# Patient Record
Sex: Female | Born: 1935 | Race: Asian | Hispanic: No | Marital: Single
Health system: Southern US, Community
[De-identification: ages and names within clinical notes are randomized; demographics above are authoritative.]

---

## 2014-05-02 ENCOUNTER — Encounter (HOSPITAL_COMMUNITY): Payer: Self-pay | Admitting: Emergency Medicine

## 2014-05-02 ENCOUNTER — Emergency Department (HOSPITAL_COMMUNITY)
Admission: EM | Admit: 2014-05-02 | Discharge: 2014-05-02 | Disposition: A | Discharge status: Home / Self Care | Payer: Medicare Other | Attending: Emergency Medicine | Admitting: Emergency Medicine

## 2014-05-02 ENCOUNTER — Emergency Department (HOSPITAL_COMMUNITY): Payer: Medicare Other

## 2014-05-02 DIAGNOSIS — R071 Chest pain on breathing: Secondary | ICD-10-CM | POA: Insufficient documentation

## 2014-05-02 DIAGNOSIS — R0789 Other chest pain: Secondary | ICD-10-CM

## 2014-05-02 DIAGNOSIS — R079 Chest pain, unspecified: Secondary | ICD-10-CM | POA: Diagnosis present

## 2014-05-02 LAB — CBC
HCT: 40.5 % (ref 36.0–46.0)
Hemoglobin: 13.8 g/dL (ref 12.0–15.0)
MCH: 32 pg (ref 26.0–34.0)
MCHC: 34.1 g/dL (ref 30.0–36.0)
MCV: 94 fL (ref 78.0–100.0)
Platelets: 272 10*3/uL (ref 150–400)
RBC: 4.31 MIL/uL (ref 3.87–5.11)
RDW: 13 % (ref 11.5–15.5)
WBC: 5.7 10*3/uL (ref 4.0–10.5)

## 2014-05-02 LAB — BASIC METABOLIC PANEL
ANION GAP: 11 (ref 5–15)
BUN: 17 mg/dL (ref 6–23)
CHLORIDE: 100 meq/L (ref 96–112)
CO2: 27 mEq/L (ref 19–32)
Calcium: 9.6 mg/dL (ref 8.4–10.5)
Creatinine, Ser: 0.58 mg/dL (ref 0.50–1.10)
GFR calc non Af Amer: 84 mL/min — ABNORMAL LOW (ref >90)
Glucose, Bld: 137 mg/dL — ABNORMAL HIGH (ref 70–99)
POTASSIUM: 4.9 meq/L (ref 3.7–5.3)
SODIUM: 138 meq/L (ref 137–147)

## 2014-05-02 LAB — I-STAT TROPONIN, ED: TROPONIN I, POC: 0 ng/mL (ref 0.00–0.08)

## 2014-05-02 NOTE — ED Notes (Signed)
Pt reports having mid chest pains for several days, increases when moving and difficulty sleeping. Reports pain improved with rest but wants eval. No acute distress noted at triage, ekg done.

## 2014-05-02 NOTE — Discharge Instructions (Signed)
Chest Pain (Nonspecific) °It is often hard to give a specific diagnosis for the cause of chest pain. There is always a chance that your pain could be related to something serious, such as a heart attack or a blood clot in the lungs. You need to follow up with your health care provider for further evaluation. °CAUSES  °· Heartburn. °· Pneumonia or bronchitis. °· Anxiety or stress. °· Inflammation around your heart (pericarditis) or lung (pleuritis or pleurisy). °· A blood clot in the lung. °· A collapsed lung (pneumothorax). It can develop suddenly on its own (spontaneous pneumothorax) or from trauma to the chest. °· Shingles infection (herpes zoster virus). °The chest wall is composed of bones, muscles, and cartilage. Any of these can be the source of the pain. °· The bones can be bruised by injury. °· The muscles or cartilage can be strained by coughing or overwork. °· The cartilage can be affected by inflammation and become sore (costochondritis). °DIAGNOSIS  °Lab tests or other studies may be needed to find the cause of your pain. Your health care provider may have you take a test called an ambulatory electrocardiogram (ECG). An ECG records your heartbeat patterns over a 24-hour period. You may also have other tests, such as: °· Transthoracic echocardiogram (TTE). During echocardiography, sound waves are used to evaluate how blood flows through your heart. °· Transesophageal echocardiogram (TEE). °· Cardiac monitoring. This allows your health care provider to monitor your heart rate and rhythm in real time. °· Holter monitor. This is a portable device that records your heartbeat and can help diagnose heart arrhythmias. It allows your health care provider to track your heart activity for several days, if needed. °· Stress tests by exercise or by giving medicine that makes the heart beat faster. °TREATMENT  °· Treatment depends on what may be causing your chest pain. Treatment may include: °¨ Acid blockers for  heartburn. °¨ Anti-inflammatory medicine. °¨ Pain medicine for inflammatory conditions. °¨ Antibiotics if an infection is present. °· You may be advised to change lifestyle habits. This includes stopping smoking and avoiding alcohol, caffeine, and chocolate. °· You may be advised to keep your head raised (elevated) when sleeping. This reduces the chance of acid going backward from your stomach into your esophagus. °Most of the time, nonspecific chest pain will improve within 2-3 days with rest and mild pain medicine.  °HOME CARE INSTRUCTIONS  °· If antibiotics were prescribed, take them as directed. Finish them even if you start to feel better. °· For the next few days, avoid physical activities that bring on chest pain. Continue physical activities as directed. °· Do not use any tobacco products, including cigarettes, chewing tobacco, or electronic cigarettes. °· Avoid drinking alcohol. °· Only take medicine as directed by your health care provider. °· Follow your health care provider's suggestions for further testing if your chest pain does not go away. °· Keep any follow-up appointments you made. If you do not go to an appointment, you could develop lasting (chronic) problems with pain. If there is any problem keeping an appointment, call to reschedule. °SEEK MEDICAL CARE IF:  °· Your chest pain does not go away, even after treatment. °· You have a rash with blisters on your chest. °· You have a fever. °SEEK IMMEDIATE MEDICAL CARE IF:  °· You have increased chest pain or pain that spreads to your arm, neck, jaw, back, or abdomen. °· You have shortness of breath. °· You have an increasing cough, or you cough   up blood. °· You have severe back or abdominal pain. °· You feel nauseous or vomit. °· You have severe weakness. °· You faint. °· You have chills. °This is an emergency. Do not wait to see if the pain will go away. Get medical help at once. Call your local emergency services (911 in [place] Do not drive  yourself to the hospital. °MAKE SURE YOU:  °· Understand these instructions. °· Will watch your condition. °· Will get help right away if you are not doing well or get worse. °Document Released: 06/17/2005 Document Revised: 09/12/2013 Document Reviewed: 04/12/2008 °ExitCare® Patient Information ©2015 [employer]. This information is not intended to replace advice given to you by your health care provider. Make sure you discuss any questions you have with your health care provider. ° ° °Emergency Department Resource Guide °1) Find a Doctor and Pay Out of Pocket °Although you won't have to find out who is covered by your insurance plan, it is a good idea to ask around and get recommendations. You will then need to call the office and see if the doctor you have chosen will accept you as a new patient and what types of options they offer for patients who are self-pay. Some doctors offer discounts or will set up payment plans for their patients who do not have insurance, but you will need to ask so you aren't surprised when you get to your appointment. ° °2) Contact Your Local Health Department °Not all health departments have doctors that can see patients for sick visits, but many do, so it is worth a call to see if yours does. If you don't know where your local health department is, you can check in your phone book. The CDC also has a tool to help you locate your state's health department, and many state websites also have listings of all of their local health departments. ° °3) Find a Walk-in Clinic °If your illness is not likely to be very severe or complicated, you may want to try a walk in clinic. These are popping up all over the country in pharmacies, drugstores, and shopping centers. They're usually staffed by nurse practitioners or physician assistants that have been trained to treat common illnesses and complaints. They're usually fairly quick and inexpensive. However, if you have serious medical issues or  chronic medical problems, these are probably not your best option. ° °No Primary Care Doctor: °- Call [facility] at  832-8000 - they can help you locate a primary care doctor that  accepts your insurance, provides certain services, etc. °- Physician Referral Service- 1-800-533-3463 ° °Chronic Pain Problems: °Organization         Address  Phone   Notes  °[facility]  (336) 297-2271 Patients need to be referred by their primary care doctor.  ° °Medication Assistance: °Organization         Address  Phone   Notes  °[place] [facility] [address] °[place], [place] [address] (336) 641-8030 --Must be a resident of [place] °-- Must have NO insurance coverage whatsoever (no Medicaid/ Medicare, etc.) °-- The pt. MUST have a primary care doctor that directs their care regularly and follows them in the community °  °[facility]  (866) 331-1348   °[employer]  (888) 892-1162   ° °Agencies that provide inexpensive medical care: °Organization         Address  Phone   Notes  °[facility]  (  336) 832-8035   °[facility]    (336) 832-7272   °[facility] [address] °[place], [place] [address] (336) 832-4777   °[facility] [address], °[place] (336) 271-4999   °[facility]    (336) 373-0678   °[facility]    (336) 272-1050   °[facility] ° [address], [place] Phone:  (336) 832-4444, Fax:  (336) 832-4440 Hours of Operation:  9 am - 6 pm, M-F.  Also accepts Medicaid/Medicare and self-pay.  °[facility] ° [address], [place] Phone: (336) 832-3150, Fax: (336) 832-3151. Hours of Operation:  8:30 am - 5:30 pm, M-F.  Also accepts Medicaid and self-pay.  °[facility] [place] [address], [place] Phone: (336) 878-6027   °[facility] [address], [place], [place]  (336)723-1848, Ext. 123 Mondays & Thursdays: 7-9 AM.  First 15 patients are seen on a first come, first serve basis. °  ° °Medicaid-accepting [place] Providers: ° °Organization         Address  Phone   Notes  °[facility] [address], [place] (336) 641-2100 Also accepts self-pay patients.  °[facility] [address], [place] ° (336) 856-9996   °[facility] [address], [place] (336) 288-8857   °[facility] [address] [place] [address], [place] (336) 299-7000   °Veita Bland [address], [place]  ° (336) 373-1557 Only accepts  Access Medicaid patients after they have their name applied to their card.  ° °Self-Pay (no insurance) in [place]: ° °Organization         Address  Phone   Notes  °Sickle Cell Patients, [facility] [address], [place] (336) 832-1970   °[facility] [address], [place] (336) 832-4400   °[facility] ° [address], [place] (336) 992-4800   °[facility]/Dr. Osei-Bonsu ° [address] [place] [address], [place] or [address], [place] (336) 841-8500 Phone number for both [place] and [place] locations is the same.  °[facility] [address], [place] (336) 299-0000   °[facility] [address] [place] [address], [place] or [address] (336) 852-7530 °(336) 878-2260   °[facility] [address],  (336) 350-1642, phone; (336) 294-5005, fax Sees patients 1st and 3rd Saturday of every month.  Must not qualify for public or private insurance (i.e. Medicaid, Medicare, [place] Health Choice, Veterans' Benefits) • Household income should be no more than 200% of the poverty level •The clinic cannot treat you if you are pregnant or think you are pregnant • Sexually transmitted  diseases are not treated at the clinic.  ° ° °Dental Care: °Organization         Address  Phone  Notes  °[facility] [address],  (336) 641-6152 Accepts children up to age 21 who are enrolled in Medicaid or [place] Health Choice; pregnant women with a Medicaid card; and children who have applied for Medicaid or [place] Health Choice, but were declined, whose parents can pay a reduced fee at time of service.  °[facility] [place]    [address], [place] (336) 641-7733 Accepts children up to age 21 who are enrolled in Medicaid or [place] Health Choice; pregnant women with a Medicaid card; and children who have applied for Medicaid or [place] Health Choice, but were declined, whose parents can pay a reduced fee at time of service.  °[facility] ° [address], [place] (336) 641-4533 Patients are seen by appointment only. Walk-ins are not accepted. [facility] will see patients 18 years of age and older. °Monday - Tuesday (8am-5pm) °Most Wednesdays (8:30-5pm) °$30 per visit, cash only  °[facility] ° [address], [place] (336) 641-4533 Patients are seen by appointment only. Walk-ins are not accepted. [facility] will see patients 18 years of age and older. °One Wednesday Evening (Monthly: Volunteer Based).  $30 per visit, cash only  °[facility]  (919) 537-3737 for adults; Children under age 4, call Graduate Pediatric Dentistry at (919) [phone]. Children aged 4-14, please call (919) [phone] to request a pediatric application. ° Dental services are provided in all areas of dental care including fillings, crowns and bridges, complete and partial dentures, implants, gum treatment, root canals, and extractions. Preventive care is also provided. Treatment is provided to both adults and children. °Patients are selected via a  lottery and there is often a waiting list. °  °[facility] [address], °[place] ° (336) 763-8833 [email or web address].[email or web address] °  °[facility] [address], [place], [place] (336)723-1848, Ext. 123 Second and Fourth Thursday of each month, opens at 6:30 AM; Clinic ends at 9 AM.  Patients are seen on a first-come first-served basis, and a limited number are seen during each clinic.  ° °[facility] ° [address], [place], [place] (336) 723-7904   Eligibility Requirements °You must have lived in [place], [place], or [place] counties for at least the last three months. °  You cannot be eligible for state or federal sponsored healthcare insurance, including Veterans Administration, Medicaid, or Medicare. °  You generally cannot be eligible for healthcare insurance through your employer.  °  How to apply: °Eligibility screenings are held every Tuesday and Wednesday afternoon from 1:00 pm until 4:00 pm. You do not need an appointment for the interview!  °[facility] [address], [place], [place] 336-631-2330   °[facility]  336-342-8273   °[facility]  336-703-3100   °[facility]  336-570-6415   ° °Behavioral Health Resources in the Community: °Intensive Outpatient Programs °Organization         Address  Phone  Notes  °[facility] [address], [place], [place] 336-878-6098   °[facility] [address], [place], [place] 336-832-9800   °[facility] [address], [place], [place] ° 336-882-2125   °[facility] [address],  °[place], [place] 1-800-853-5163 or 336-641-4981   °Substance Abuse Resources °Organization         Address  Phone  Notes  °[facility]  336-882-2125   °[facility]  336-784-9470   °[facility]  336-285-9073   °[facility]  336-845-3988   °Residential &  Outpatient Substance Abuse Program  1-800-659-3381   °Psychological Services °Organization         Address  Phone  Notes  °[facility]  336- 832-9600   °  [facility]  336- 378-7881   °[facility] [address], [place] 1-800-853-5163 or 336-641-4981   ° °Mobile Crisis Teams °Organization         Address  Phone  Notes  °[facility], Mobile Crisis Care Unit  1-877-626-1772   °Assertive °[facility] ° [address] [place], [place] 336-834-9664   °Sharon DeEsch [address] °[place] 336-554-5454   ° °Self-Help/Support Groups °Organization         Address  Phone             Notes  °[facility] - variety of support groups  336- 373-1402 Call for more information  °Narcotics Anonymous (NA), [facility] [address], °[place]  2 meetings at this location  ° °Residential Treatment Programs °Organization         Address  Phone  Notes  °[facility] [address],    °[place]  1-866-801-8205   °[facility] ° [address], [place], [place] 704-293-8524   °[facility] [address], [place] 336-845-3988 Admissions: 8am-3pm M-F  °[facility] [address]    °[place], [place] 336-841-1104   °[facility] [address], [place], [place] 336-379-7146   °[facility] [address]  °[place], [place] 336-285-9073   °[facility] Intensive Outpatient [address], [place], [place] 336-852-3033   °[facility] [address]  °[place], [place] 1-877-615-2722 or 336-784-9470   °[facility] [address] [place], [place] 336-227-7417 Accepts Medicaid  °[facility] [address]  °[place] 1-800-659-3381 Substance Abuse/Addiction Treatment  ° °[place] Behavioral Health Resources °Organization          Address  Phone  Notes  °[facility]  (888) 581-9988   °Julie Brannon, PhD [address] [place], [place]   (336) 349-5553 or (336) 951-0000   °[facility]   [address] °[place], [place] (336) 349-4454   °[facility] [address], [place], [place] (336) 342-8316 Insurance/Medicaid/sponsorship through [facility]  °[facility] [address]                                    [place], [place] (336) 342-8316 Therapy/tele-psych/case  °[facility] [address]  ° [place],  (336) 349-2233    °Dr. Arfeen  (336) 349-4544   °[facility] [place]  [employer] [place] [facility] 1) [address],  °2) [address], [place] °3)  [address], [place] (336) 349-3220 °(336) 342-7768 ° °(336) 342-8140   °[place] [facility] (336) 342-1394 or (336) 342-3537 (After Hours)    ° ° ° °

## 2014-05-02 NOTE — ED Notes (Signed)
Pt comfortable with discharge and follow up instructions. No prescriptions. 
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
## 2016-11-29 ENCOUNTER — Encounter: Payer: Self-pay | Admitting: *Deleted

## 2016-11-29 NOTE — Congregational Nurse Program (Unsigned)
Congregational Nurse Program Note  Date of Encounter: 11/29/2016  Past Medical History: No past medical history on file.  Encounter Details:     CNP Questionnaire - 11/29/16 2028      Patient Demographics   Is this a new or existing patient? New   Patient is considered a/an Immigrant   Race Asian     Patient Assistance   [facility]   Patient's financial/insurance status Medicare   Uninsured Patient ([employer] officer, trade unionCard/Care Connects) No   Patient referred to apply for the following financial assistance Not Applicable   Food insecurities addressed Not Applicable   Transportation assistance No   Assistance securing medications No   Educational health offerings Diabetes;Exercise/physical activity;Nutrition;Hypertension     Encounter Details   Primary purpose of visit Education/Health Concerns;Post PCP Visit   Was an Emergency Department visit averted? Not Applicable  pt refused to go urgent care   Does patient have a medical provider? Yes   Patient referred to Clinic  has appoint with MD one week later   Was a mental health screening completed? (GAINS tool) No   Does patient have dental issues? Yes  lost front lower teeth 2   Was a dental referral made? No   Does patient have vision issues? No   Does your patient have an abnormal blood pressure today? Yes   Since previous encounter, have you referred patient for abnormal blood pressure that resulted in a new diagnosis or medication change? Yes  no medicine on   Does your patient have an abnormal blood glucose today? Yes  brought own machine, cbg 145 non fasting   Since previous encounter, have you referred patient for abnormal blood glucose that resulted in a new diagnosis or medication change? No  see CNP note    The 83 years old female, lives alone, when pt. Visited clinic couple of weeks ago , high blood sugar and high BP at the Dr's office. BP was 180's but no medicine on, also CBG was 190's. She  said Dr. Rx med. For high blood sugar, but she would not take , wanted to control by diet. Told me her blood sugar down to 160's during past week and today, she brought her CBG machine, checked sugar, 145 @ 1030 today, non fasting.  Taught about hypertension, advised to go to urgent care, but she said her BP was 180's at the Dr's office, no BP medicine prescribed. Make an note and gave to her that need show to MD. Regarding BP. Will F/u BP and CBG next week. Taught pt that high risk of stroke, DM,damage to other organ, and taught nutrition and information given. She said would not taking medicine because have ti take all the time. She never had HTN or DM before. No other c/o voiced.

## 2016-12-08 ENCOUNTER — Encounter: Payer: Self-pay | Admitting: *Deleted
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
## 2016-12-13 LAB — GLUCOSE, POCT (MANUAL RESULT ENTRY): POC GLUCOSE: 133 mg/dL — AB (ref 70–99)

## 2016-12-22 ENCOUNTER — Encounter: Payer: Self-pay | Admitting: *Deleted

## 2016-12-22 DIAGNOSIS — I10 Essential (primary) hypertension: Secondary | ICD-10-CM | POA: Insufficient documentation

## 2016-12-22 DIAGNOSIS — Z139 Encounter for screening, unspecified: Secondary | ICD-10-CM

## 2016-12-22 DIAGNOSIS — R739 Hyperglycemia, unspecified: Secondary | ICD-10-CM | POA: Insufficient documentation
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
## 2017-01-12 ENCOUNTER — Telehealth: Payer: Self-pay | Admitting: *Deleted

## 2017-02-07 ENCOUNTER — Encounter: Payer: Self-pay | Admitting: *Deleted

## 2017-02-07 DIAGNOSIS — Z139 Encounter for screening, unspecified: Secondary | ICD-10-CM

## 2017-02-07 NOTE — Congregational Nurse Program (Unsigned)
02/07/2017 1030am Pt visited to check CBG and BP Said on medicine for Diabetes to eat freely. Taught regarding diet and exercise even on the medicine. CGG result; 146. F/u for DM and HTN required.

## 2020-02-06 ENCOUNTER — Emergency Department (HOSPITAL_COMMUNITY)
Admission: EM | Admit: 2020-02-06 | Discharge: 2020-02-06 | Disposition: A | Discharge status: Home / Self Care | Payer: Medicare Other | Attending: Emergency Medicine | Admitting: Emergency Medicine

## 2020-02-06 ENCOUNTER — Emergency Department (HOSPITAL_COMMUNITY): Payer: Medicare Other

## 2020-02-06 ENCOUNTER — Other Ambulatory Visit: Payer: Self-pay

## 2020-02-06 ENCOUNTER — Encounter (HOSPITAL_COMMUNITY): Payer: Self-pay

## 2020-02-06 DIAGNOSIS — R630 Anorexia: Secondary | ICD-10-CM | POA: Diagnosis not present

## 2020-02-06 DIAGNOSIS — R739 Hyperglycemia, unspecified: Secondary | ICD-10-CM

## 2020-02-06 DIAGNOSIS — E1165 Type 2 diabetes mellitus with hyperglycemia: Secondary | ICD-10-CM | POA: Insufficient documentation

## 2020-02-06 DIAGNOSIS — R1110 Vomiting, unspecified: Secondary | ICD-10-CM | POA: Diagnosis not present

## 2020-02-06 DIAGNOSIS — F0390 Unspecified dementia without behavioral disturbance: Secondary | ICD-10-CM | POA: Diagnosis not present

## 2020-02-06 DIAGNOSIS — Z7984 Long term (current) use of oral hypoglycemic drugs: Secondary | ICD-10-CM | POA: Insufficient documentation

## 2020-02-06 DIAGNOSIS — R112 Nausea with vomiting, unspecified: Secondary | ICD-10-CM

## 2020-02-06 DIAGNOSIS — I10 Essential (primary) hypertension: Secondary | ICD-10-CM | POA: Insufficient documentation

## 2020-02-06 LAB — CBC
HCT: 43 % (ref 36.0–46.0)
Hemoglobin: 14.4 g/dL (ref 12.0–15.0)
MCH: 30.8 pg (ref 26.0–34.0)
MCHC: 33.5 g/dL (ref 30.0–36.0)
MCV: 92.1 fL (ref 80.0–100.0)
Platelets: 230 10*3/uL (ref 150–400)
RBC: 4.67 MIL/uL (ref 3.87–5.11)
RDW: 12.3 % (ref 11.5–15.5)
WBC: 5.7 10*3/uL (ref 4.0–10.5)
nRBC: 0 % (ref 0.0–0.2)

## 2020-02-06 LAB — DIFFERENTIAL
Abs Immature Granulocytes: 0.01 10*3/uL (ref 0.00–0.07)
Basophils Absolute: 0.1 10*3/uL (ref 0.0–0.1)
Basophils Relative: 2 %
Eosinophils Absolute: 0.1 10*3/uL (ref 0.0–0.5)
Eosinophils Relative: 1 %
Immature Granulocytes: 0 %
Lymphocytes Relative: 32 %
Lymphs Abs: 1.8 10*3/uL (ref 0.7–4.0)
Monocytes Absolute: 0.4 10*3/uL (ref 0.1–1.0)
Monocytes Relative: 7 %
Neutro Abs: 3.3 10*3/uL (ref 1.7–7.7)
Neutrophils Relative %: 58 %

## 2020-02-06 LAB — CBG MONITORING, ED: Glucose-Capillary: 244 mg/dL — ABNORMAL HIGH (ref 70–99)

## 2020-02-06 LAB — I-STAT CHEM 8, ED
BUN: 20 mg/dL (ref 8–23)
Calcium, Ion: 1.17 mmol/L (ref 1.15–1.40)
Chloride: 101 mmol/L (ref 98–111)
Creatinine, Ser: 0.6 mg/dL (ref 0.44–1.00)
Glucose, Bld: 300 mg/dL — ABNORMAL HIGH (ref 70–99)
HCT: 43 % (ref 36.0–46.0)
Hemoglobin: 14.6 g/dL (ref 12.0–15.0)
Potassium: 4.3 mmol/L (ref 3.5–5.1)
Sodium: 136 mmol/L (ref 135–145)
TCO2: 28 mmol/L (ref 22–32)

## 2020-02-06 LAB — COMPREHENSIVE METABOLIC PANEL
ALT: 71 U/L — ABNORMAL HIGH (ref 0–44)
AST: 64 U/L — ABNORMAL HIGH (ref 15–41)
Albumin: 3.9 g/dL (ref 3.5–5.0)
Alkaline Phosphatase: 73 U/L (ref 38–126)
Anion gap: 12 (ref 5–15)
BUN: 17 mg/dL (ref 8–23)
CO2: 23 mmol/L (ref 22–32)
Calcium: 9.5 mg/dL (ref 8.9–10.3)
Chloride: 99 mmol/L (ref 98–111)
Creatinine, Ser: 0.81 mg/dL (ref 0.44–1.00)
GFR calc Af Amer: >60 mL/min (ref >60)
GFR calc non Af Amer: >60 mL/min (ref >60)
Glucose, Bld: 297 mg/dL — ABNORMAL HIGH (ref 70–99)
Potassium: 4.6 mmol/L (ref 3.5–5.1)
Sodium: 134 mmol/L — ABNORMAL LOW (ref 135–145)
Total Bilirubin: 0.8 mg/dL (ref 0.3–1.2)
Total Protein: 8.4 g/dL — ABNORMAL HIGH (ref 6.5–8.1)

## 2020-02-06 LAB — PROTIME-INR
INR: 1 (ref 0.8–1.2)
Prothrombin Time: 13.2 seconds (ref 11.4–15.2)

## 2020-02-06 LAB — APTT: aPTT: 30 seconds (ref 24–36)

## 2020-02-06 MED ORDER — JANUMET 50-1000 MG PO TABS: 1.0000 | ORAL_TABLET | Freq: Two times a day (BID) | ORAL | 0 refills | Status: AC

## 2020-02-06 MED ORDER — ONDANSETRON HCL 8 MG PO TABS: 8.0000 mg | ORAL_TABLET | Freq: Three times a day (TID) | ORAL | 0 refills | Status: AC | PRN

## 2020-02-06 MED ORDER — SODIUM CHLORIDE 0.9% FLUSH: 3.0000 mL | Freq: Once | INTRAVENOUS | Status: DC

## 2020-02-06 NOTE — Progress Notes (Addendum)
CSW spoke with French Ana, who is the Comptroller at the patient's apartment complex - [address] on [address]. French Ana reports patient lives alone with no local family support. French Ana reports [employer] is the patient's PCP. French Ana reports the patient is very hard of hearing. French Ana reports the patient's emergency contact Emily Filbert is deceased and was a former Network engineer. French Ana reports that the patient speaks [place] and very limited [place]. French Ana will review the patient's housing record and return call to CSW with a family contact if available.   Edwin Dada, MSW, LCSW-A Transitions of Care  Clinical Social Worker  [facility] Emergency Departments  Medical ICU [phone]

## 2020-02-06 NOTE — Social Work (Signed)
ToC Team spoke via phone to Pt's nephew and great niece, Darral Dash @ [phone] asking for update on Pt.  Team was able to gather information regarding family situation and will update after medical evaluation.

## 2020-02-06 NOTE — ED Notes (Signed)
Pt able to tolerated fluids and crackers as ordered, no sign of nausea, vomiting or swallow problem noticed.

## 2020-02-06 NOTE — Discharge Planning (Signed)
RNCM notified of pt ED admission and possible need for high level of care post medical evaluation. RNCM given pt community SW number [phone]) to gather collateral information.

## 2020-02-06 NOTE — ED Triage Notes (Signed)
Pt bib [facility] w/ c/o worsening dementia x 1 year. Pt c/o headache x 3 days. At baseline neuro status. Pt living at independent living facility, however, concern from social worker there that pt is no longer capable of caring for herself, possible need for placement at nursing facility. EMS VSS.

## 2020-02-06 NOTE — ED Notes (Signed)
Case management provided w/ patient's social workers phone number Kathy Miller [phone])

## 2020-02-06 NOTE — Progress Notes (Signed)
CSW obtained verbal permission from Pt to contact [place] from [employer].[phone] to update about Pt's plans for discharge.  For Correction of Notes: [phone] is not a French Ana, she is the Child psychotherapist and friend of the Pt who looks out for the Pt. It was Health and safety inspector who contacted ToC staff to supply collateral information earlier today as well as to call EMS.  French Ana is also willing to act as a contact for any Home Health services.  CSW also updated Pt's niece Helen @[phone] French Ana  CSW accompanied EDP during interview with Pt and nephew, Mr. that was conducted via [employer] with interpreter # [phone] providing [facility] interpretation during interview.
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
## 2020-02-06 NOTE — Discharge Instructions (Signed)
Start with a clear liquid diet then gradually advance to regular foods over the next few days.  Follow-up with your doctor for further care and treatment as needed.

## 2020-02-07 NOTE — Progress Notes (Signed)
CSW received call from niece requesting information about the Home Health services for this Pt. CSW was able to provide assistance so that niece can navigate language barrier between Pt and HH agency.

## 2020-02-08 ENCOUNTER — Telehealth: Payer: Self-pay | Admitting: Surgery

## 2020-02-08 ENCOUNTER — Telehealth: Payer: Self-pay

## 2020-02-08 NOTE — Telephone Encounter (Signed)
ED CM received call from [facility] today that they are unable to provide [facility] serivcies due to being out of network with [facility].   CM contacted [facility] with referral, will review and get back to [place] with a decision in the am.  [facility] team will update family

## 2020-02-08 NOTE — Telephone Encounter (Signed)
CSW called niece Elby Beck to update about change in [facility] from [facility] to [facility].

## 2022-01-03 IMAGING — CT CT HEAD W/O CM
4 series · 16 of 47 positions shown, 18 images · non-contrast
Comparison: None.

CLINICAL DATA: 89-year-old female with headache for 3 days.
Worsening dementia.

EXAM:
CT HEAD WITHOUT CONTRAST
TECHNIQUE: Contiguous axial images were obtained from the base of the skull
through the vertex without intravenous contrast.

[Series 2: head without · axial · non-contrast · 0.39mm/px · z∈[-150,-40]mm · 7 of 30 slices shown, 9 images]
[im 4/30  brain]
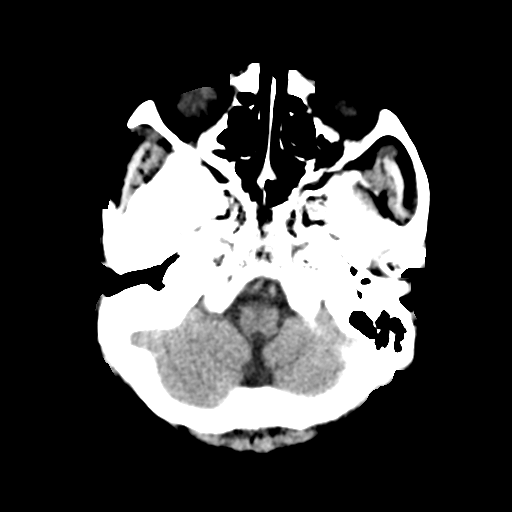
[im 4/30  bone]
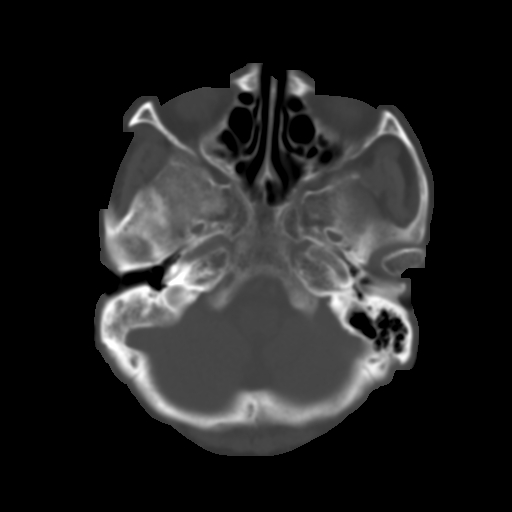
[im 8/30  brain]
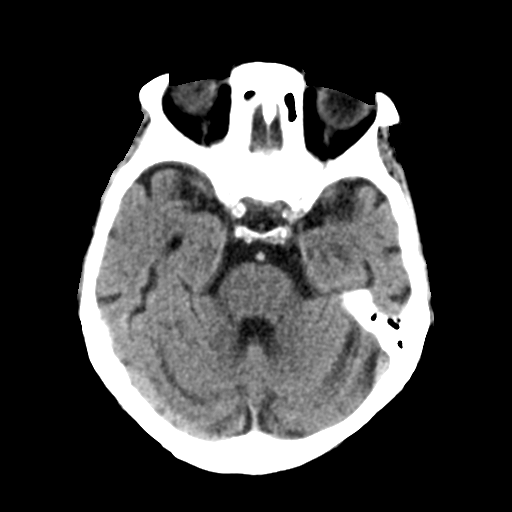
[im 11/30  brain]
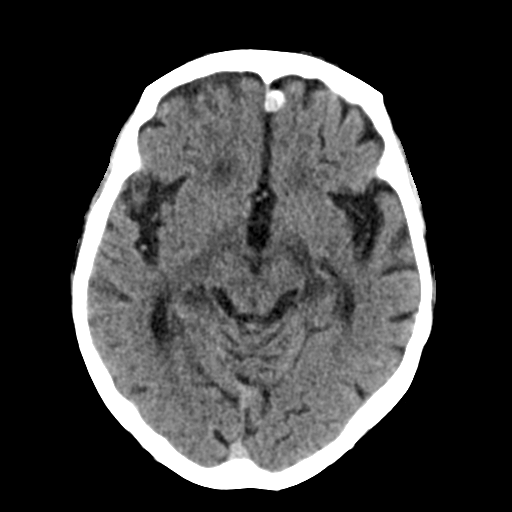
[im 15/30  brain]
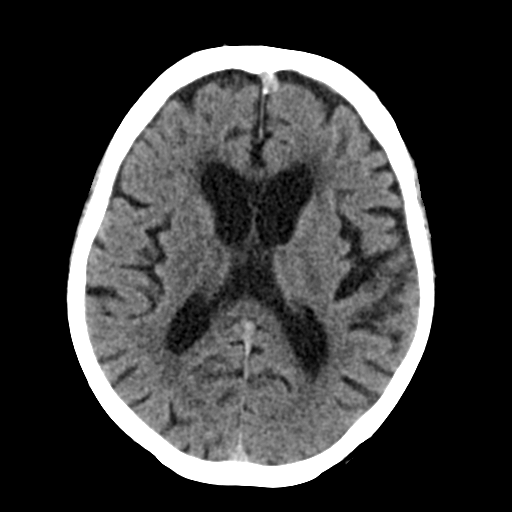
[im 19/30  brain]
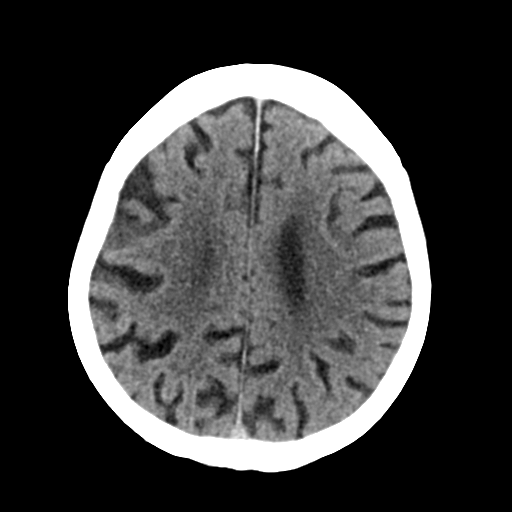
[im 19/30  bone]
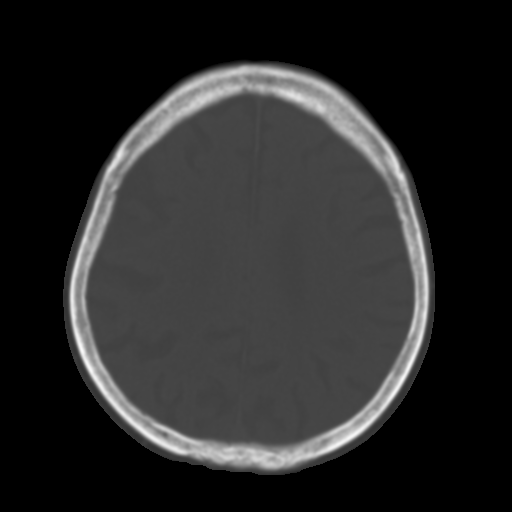
[im 22/30  brain]
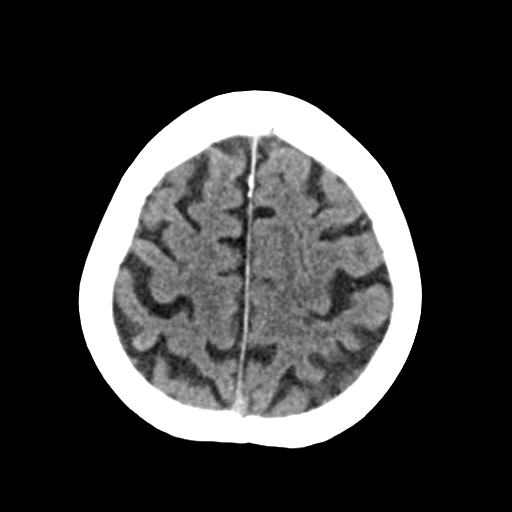
[im 26/30  brain]
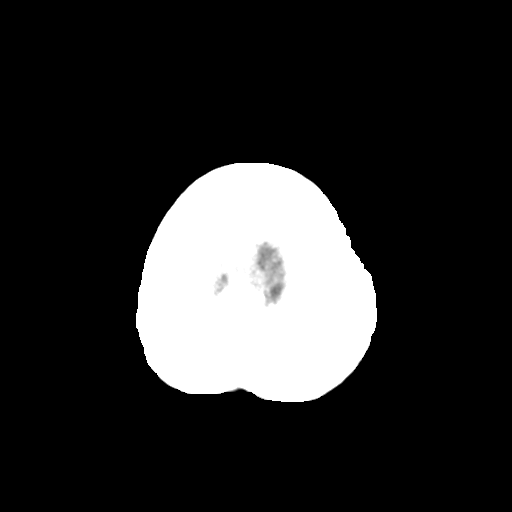

[Series 3: head bone · axial · 0.39mm/px · z∈[-152,-124]mm · 3 of 73 slices shown]
[im 8/73  bone]
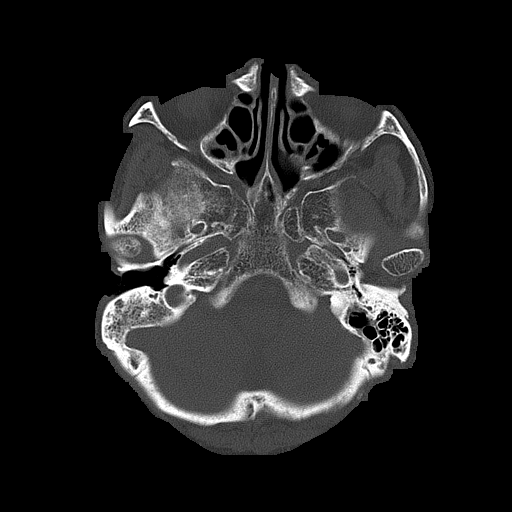
[im 15/73  bone]
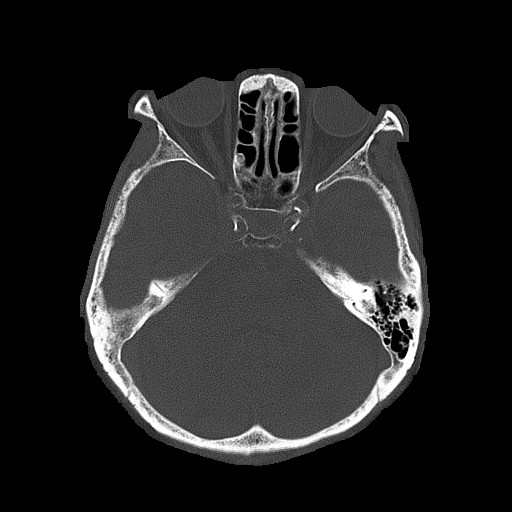
[im 22/73  bone]
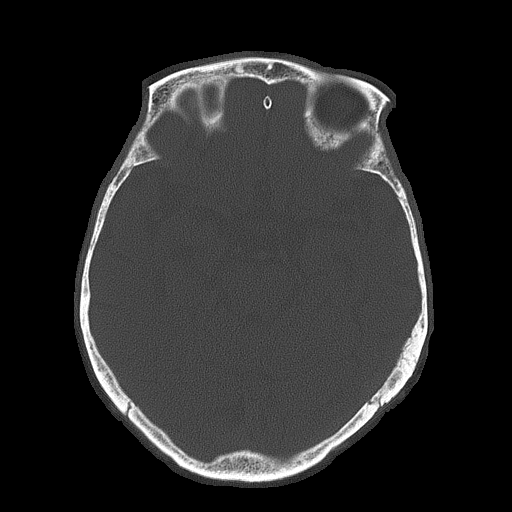

[Series 4: head without cor · coronal · non-contrast · 0.30mm/px · 3 of 67 slices shown]
[im 23/67  brain]
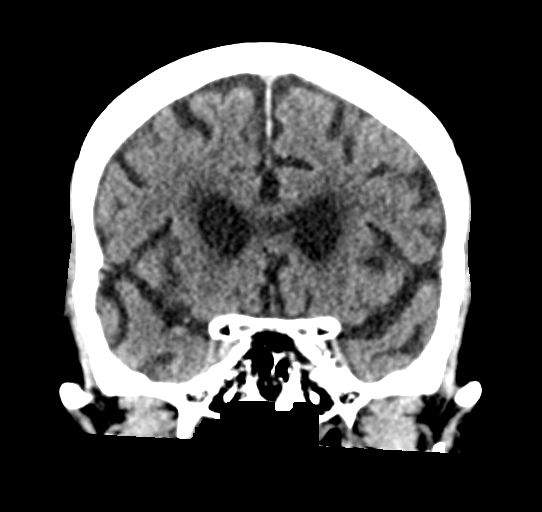
[im 30/67  brain]
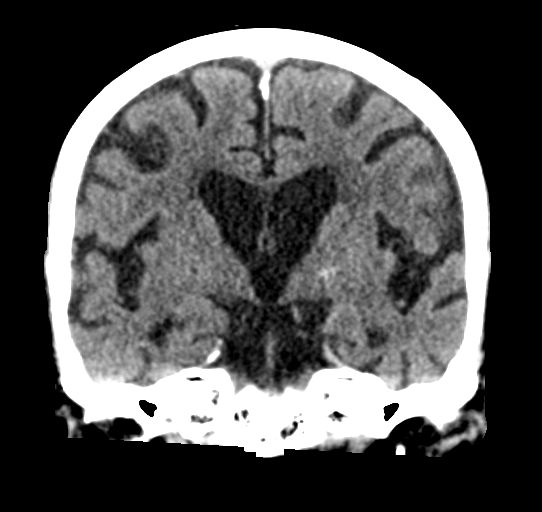
[im 37/67  brain]
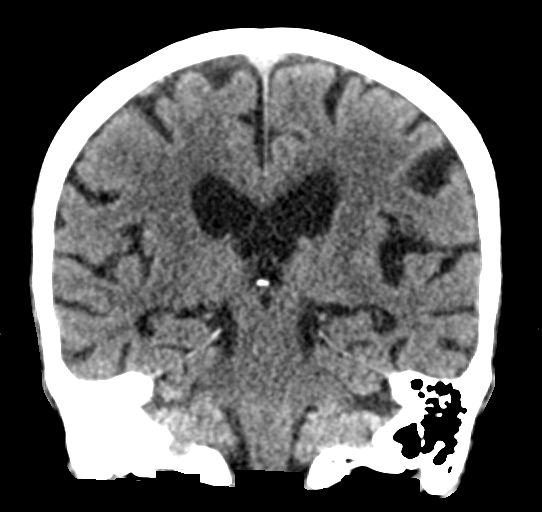

[Series 5: head without sag · sagittal · non-contrast · 0.28mm/px · 3 of 57 slices shown]
[im 19/57  brain]
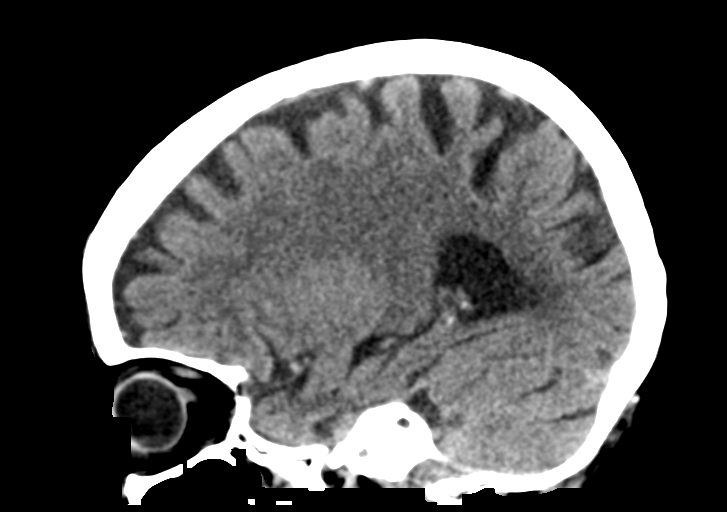
[im 29/57  brain]
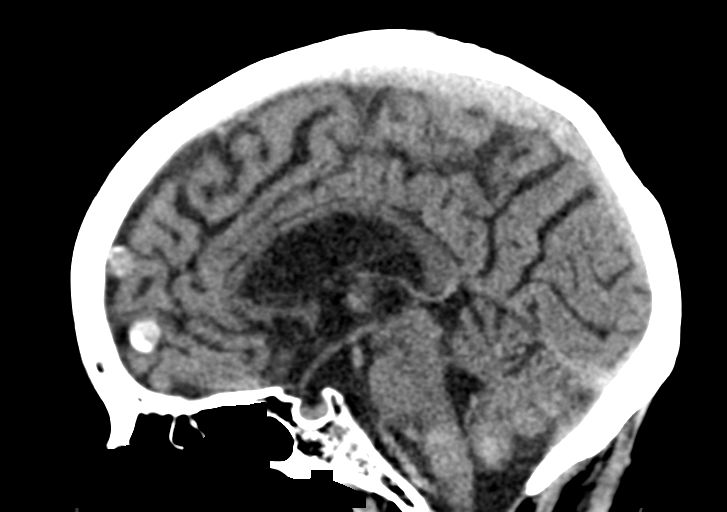
[im 38/57  brain]
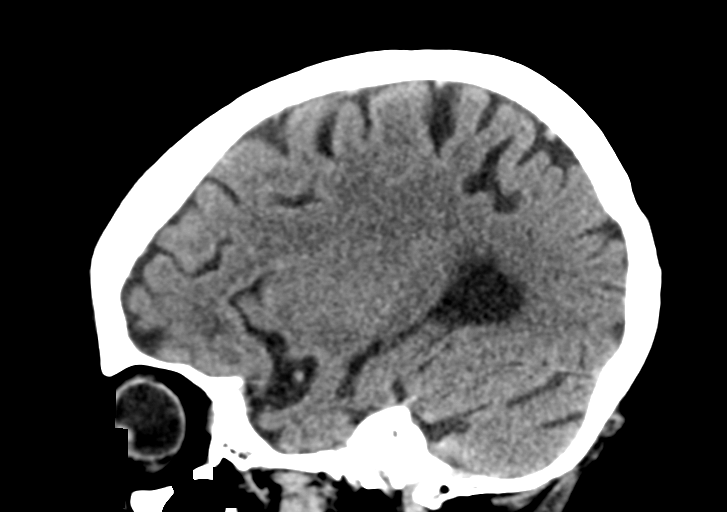

[16 of 47 positions shown; findings below may reference images not displayed]

FINDINGS: Brain: There are 2 small partially calcified nodules along the
anterior falx (series 2, images 11 and 15) each measuring 8-9 mm
diameter. No associated cerebral mass effect or edema. These could
be small dural calcifications or small para falcine meningiomas.

Cerebral volume is within normal limits for age. No midline shift or
intracranial mass effect. No ventriculomegaly. Cavum septum
pellucidum (normal variant). No acute intracranial hemorrhage
identified. No cortical encephalomalacia. And largely normal for age
gray-white matter differentiation throughout the brain. Mild
dystrophic calcifications left basal ganglia. No cortically based
acute infarct identified.

Vascular: Calcified atherosclerosis at the skull base. No suspicious
intracranial vascular hyperdensity.

Skull: No acute osseous abnormality identified.

Sinuses/Orbits: Chronic sclerosis of the right mastoids. Visualized
paranasal sinuses are clear. Tympanic cavities and left mastoids
appear clear.

Other: Negative orbit and scalp soft tissues.
IMPRESSION: 1. No acute intracranial abnormality and largely normal for age non
contrast CT appearance of the brain.

2. Two small calcified nodules along the anterior falx, appear to be
inconsequential either small dural calcifications or small
parafalcine meningiomas.
